# Patient Record
Sex: Male | Born: 1985 | Race: White | Hispanic: No | Marital: Single | Smoking: Former smoker
Health system: Southern US, Community
[De-identification: ages and names within clinical notes are randomized; demographics above are authoritative.]

---
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
## 2018-11-03 ENCOUNTER — Other Ambulatory Visit: Payer: Self-pay

## 2018-11-03 ENCOUNTER — Emergency Department (HOSPITAL_BASED_OUTPATIENT_CLINIC_OR_DEPARTMENT_OTHER)
Admission: EM | Admit: 2018-11-03 | Discharge: 2018-11-03 | Disposition: A | Discharge status: Home / Self Care | Payer: BC Managed Care – PPO | Attending: Emergency Medicine | Admitting: Emergency Medicine

## 2018-11-03 ENCOUNTER — Encounter (HOSPITAL_BASED_OUTPATIENT_CLINIC_OR_DEPARTMENT_OTHER): Payer: Self-pay | Admitting: *Deleted

## 2018-11-03 ENCOUNTER — Emergency Department (HOSPITAL_BASED_OUTPATIENT_CLINIC_OR_DEPARTMENT_OTHER): Payer: BC Managed Care – PPO

## 2018-11-03 DIAGNOSIS — Z87891 Personal history of nicotine dependence: Secondary | ICD-10-CM | POA: Diagnosis not present

## 2018-11-03 DIAGNOSIS — R0789 Other chest pain: Secondary | ICD-10-CM | POA: Insufficient documentation

## 2018-11-03 DIAGNOSIS — R079 Chest pain, unspecified: Secondary | ICD-10-CM | POA: Diagnosis present

## 2018-11-03 MED ORDER — ALUM & MAG HYDROXIDE-SIMETH 200-200-20 MG/5ML PO SUSP
15.0000 mL | Freq: Once | ORAL | Status: AC
  Administered 2018-11-03: 15 mL via ORAL
  Filled 2018-11-03: qty 30

## 2018-11-03 NOTE — Discharge Instructions (Signed)
Try zantac or pepcid twice a day.  Try to avoid things that may make this worse, most commonly these are spicy foods tomato based products fatty foods chocolate and peppermint.  Alcohol and tobacco can also make this worse.  Return to the emergency department for sudden worsening pain fever or inability to eat or drink. ° °

## 2018-11-03 NOTE — ED Triage Notes (Addendum)
Pt c/o mid sternal CP while sleeping , SOB, palpitations  , cp increased with deep breathing

## 2018-11-03 NOTE — ED Provider Notes (Signed)
[facility] EMERGENCY DEPARTMENT Provider Note   CSN: [id number] Arrival date & time: 11/03/18  1338     History   Chief Complaint Chief Complaint  Patient presents with  . Chest Pain    HPI Jared Lozano is a 33 y.o. male.  33 yo M with a cc of chest pain.  Occurred when he was trying to go to sleep.  Felt like pressure.  Improved with sitting up.  Felt he was having some trouble breathing initially and then resolved over about 20 minutes or so.  Denies exertional symptoms denies history of PE or DVT denies history of MI.  Denies hemoptysis denies lower extremity edema denies recent surgery or immobilization.  Denies cough congestion or fever.  Denies history of hypertension hyperlipidemia diabetes or smoking.  Father has had a stroke but denies history of heart attack.  The history is provided by the patient.  Chest Pain  Pain location:  Substernal area Pain quality: pressure   Pain radiates to:  Does not radiate Pain severity:  Moderate Onset quality:  Sudden Duration:  2 days Timing:  Constant Progression:  Worsening Chronicity:  New Relieved by:  Nothing Worsened by:  Nothing Ineffective treatments:  None tried Associated symptoms: no abdominal pain, no fever, no headache, no palpitations, no shortness of breath and no vomiting     History reviewed. No pertinent past medical history.  There are no active problems to display for this patient.   History reviewed. No pertinent surgical history.      Home Medications    Prior to Admission medications   Not on File    Family History History reviewed. No pertinent family history.  Social History Social History   Tobacco Use  . Smoking status: Former Games developer  . Smokeless tobacco: Never Used  Substance Use Topics  . Alcohol use: Not Currently  . Drug use: Not Currently     Allergies   Patient has no known allergies.   Review of Systems Review of Systems  Constitutional: Negative for  chills and fever.  HENT: Negative for congestion and facial swelling.   Eyes: Negative for discharge and visual disturbance.  Respiratory: Negative for shortness of breath.   Cardiovascular: Positive for chest pain. Negative for palpitations.  Gastrointestinal: Negative for abdominal pain, diarrhea and vomiting.  Musculoskeletal: Negative for arthralgias and myalgias.  Skin: Negative for color change and rash.  Neurological: Negative for tremors, syncope and headaches.  Psychiatric/Behavioral: Negative for confusion and dysphoric mood.     Physical Exam Updated Vital Signs BP (!) 141/97   Pulse 88   Temp 98.1 F (36.7 C)   Resp 18   Ht 5\' 8"  (1.727 m)   Wt 86.2 kg   SpO2 100%   BMI 28.89 kg/m   Physical Exam Vitals signs and nursing note reviewed.  Constitutional:      Appearance: He is well-developed.  HENT:     Head: Normocephalic and atraumatic.  Eyes:     Pupils: Pupils are equal, round, and reactive to light.  Neck:     Musculoskeletal: Normal range of motion and neck supple.     Vascular: No JVD.  Cardiovascular:     Rate and Rhythm: Normal rate and regular rhythm.     Heart sounds: No murmur. No friction rub. No gallop.   Pulmonary:     Effort: No respiratory distress.     Breath sounds: No wheezing.  Abdominal:     General: There is no  distension.     Tenderness: There is no guarding or rebound.  Musculoskeletal: Normal range of motion.  Skin:    Coloration: Skin is not pale.     Findings: No rash.  Neurological:     Mental Status: He is alert and oriented to person, place, and time.  Psychiatric:        Behavior: Behavior normal.      ED Treatments / Results  Labs (all labs ordered are listed, but only abnormal results are displayed) Labs Reviewed - No data to display  EKG EKG Interpretation  Date/Time:  Thursday November 03 2018 13:52:20 EST Ventricular Rate:  88 PR Interval:    QRS Duration: 101 QT Interval:  358 QTC Calculation: 434 R  Axis:   -25 Text Interpretation:  Sinus rhythm RSR' in V1 or V2, probably normal variant Probable left ventricular hypertrophy No old tracing to compare Confirmed by Melene Plan [phone]) on 11/03/2018 1:56:13 PM   Radiology Dg Chest 2 View  Result Date: 11/03/2018 CLINICAL DATA:  Heart palpitations. EXAM: CHEST - 2 VIEW COMPARISON:  No prior. FINDINGS: Mediastinum and hilar structures normal. Lungs are clear. No pleural effusion pneumothorax. Heart size normal. No acute bony abnormality. IMPRESSION: No acute cardiopulmonary disease. Electronically Signed   By: Maisie Fus  Register   On: 11/03/2018 14:25    Procedures Procedures (including critical care time)  Medications Ordered in ED Medications  alum & mag hydroxide-simeth (MAALOX/MYLANTA) 200-200-20 MG/5ML suspension 15 mL (has no administration in time range)     Initial Impression / Assessment and Plan / ED Course  I have reviewed the triage vital signs and the nursing notes.  Pertinent labs & imaging results that were available during my care of the patient were reviewed by me and considered in my medical decision making (see chart for details).     33 yo M with a chief complaint of atypical chest pain.  Will obtain the EKG and chest x-ray.  He is PERC negative.  History is completely atypical of ACS.  Feel that no lab work is required at this time.  CXR viewed by me without focal infiltrate or PTX.  D/c home.   2:30 PM:  I have discussed the diagnosis/risks/treatment options with the patient and family and believe the pt to be eligible for discharge home to follow-up with PCP. We also discussed returning to the ED immediately if new or worsening sx occur. We discussed the sx which are most concerning (e.g., sudden worsening pain, fever, inability to tolerate by mouth) that necessitate immediate return. Medications administered to the patient during their visit and any new prescriptions provided to the patient are listed  below.  Medications given during this visit Medications  alum & mag hydroxide-simeth (MAALOX/MYLANTA) 200-200-20 MG/5ML suspension 15 mL (has no administration in time range)     The patient appears reasonably screen and/or stabilized for discharge and I doubt any other medical condition or other [facility] requiring further screening, evaluation, or treatment in the ED at this time prior to discharge.    Final Clinical Impressions(s) / ED Diagnoses   Final diagnoses:  Atypical chest pain    ED Discharge Orders    None       Melene Plan, DO 11/03/18 1430

## 2019-11-24 ENCOUNTER — Ambulatory Visit: Payer: Self-pay | Attending: Internal Medicine

## 2019-11-24 DIAGNOSIS — Z20822 Contact with and (suspected) exposure to covid-19: Secondary | ICD-10-CM

## 2019-11-26 LAB — NOVEL CORONAVIRUS, NAA: SARS-CoV-2, NAA: NOT DETECTED

## 2020-02-21 IMAGING — DX DG CHEST 2V
2 series · 2 of 2 positions shown · non-contrast
Comparison: No prior.

CLINICAL DATA: Heart palpitations.

EXAM:
CHEST - 2 VIEW

[chest pa]
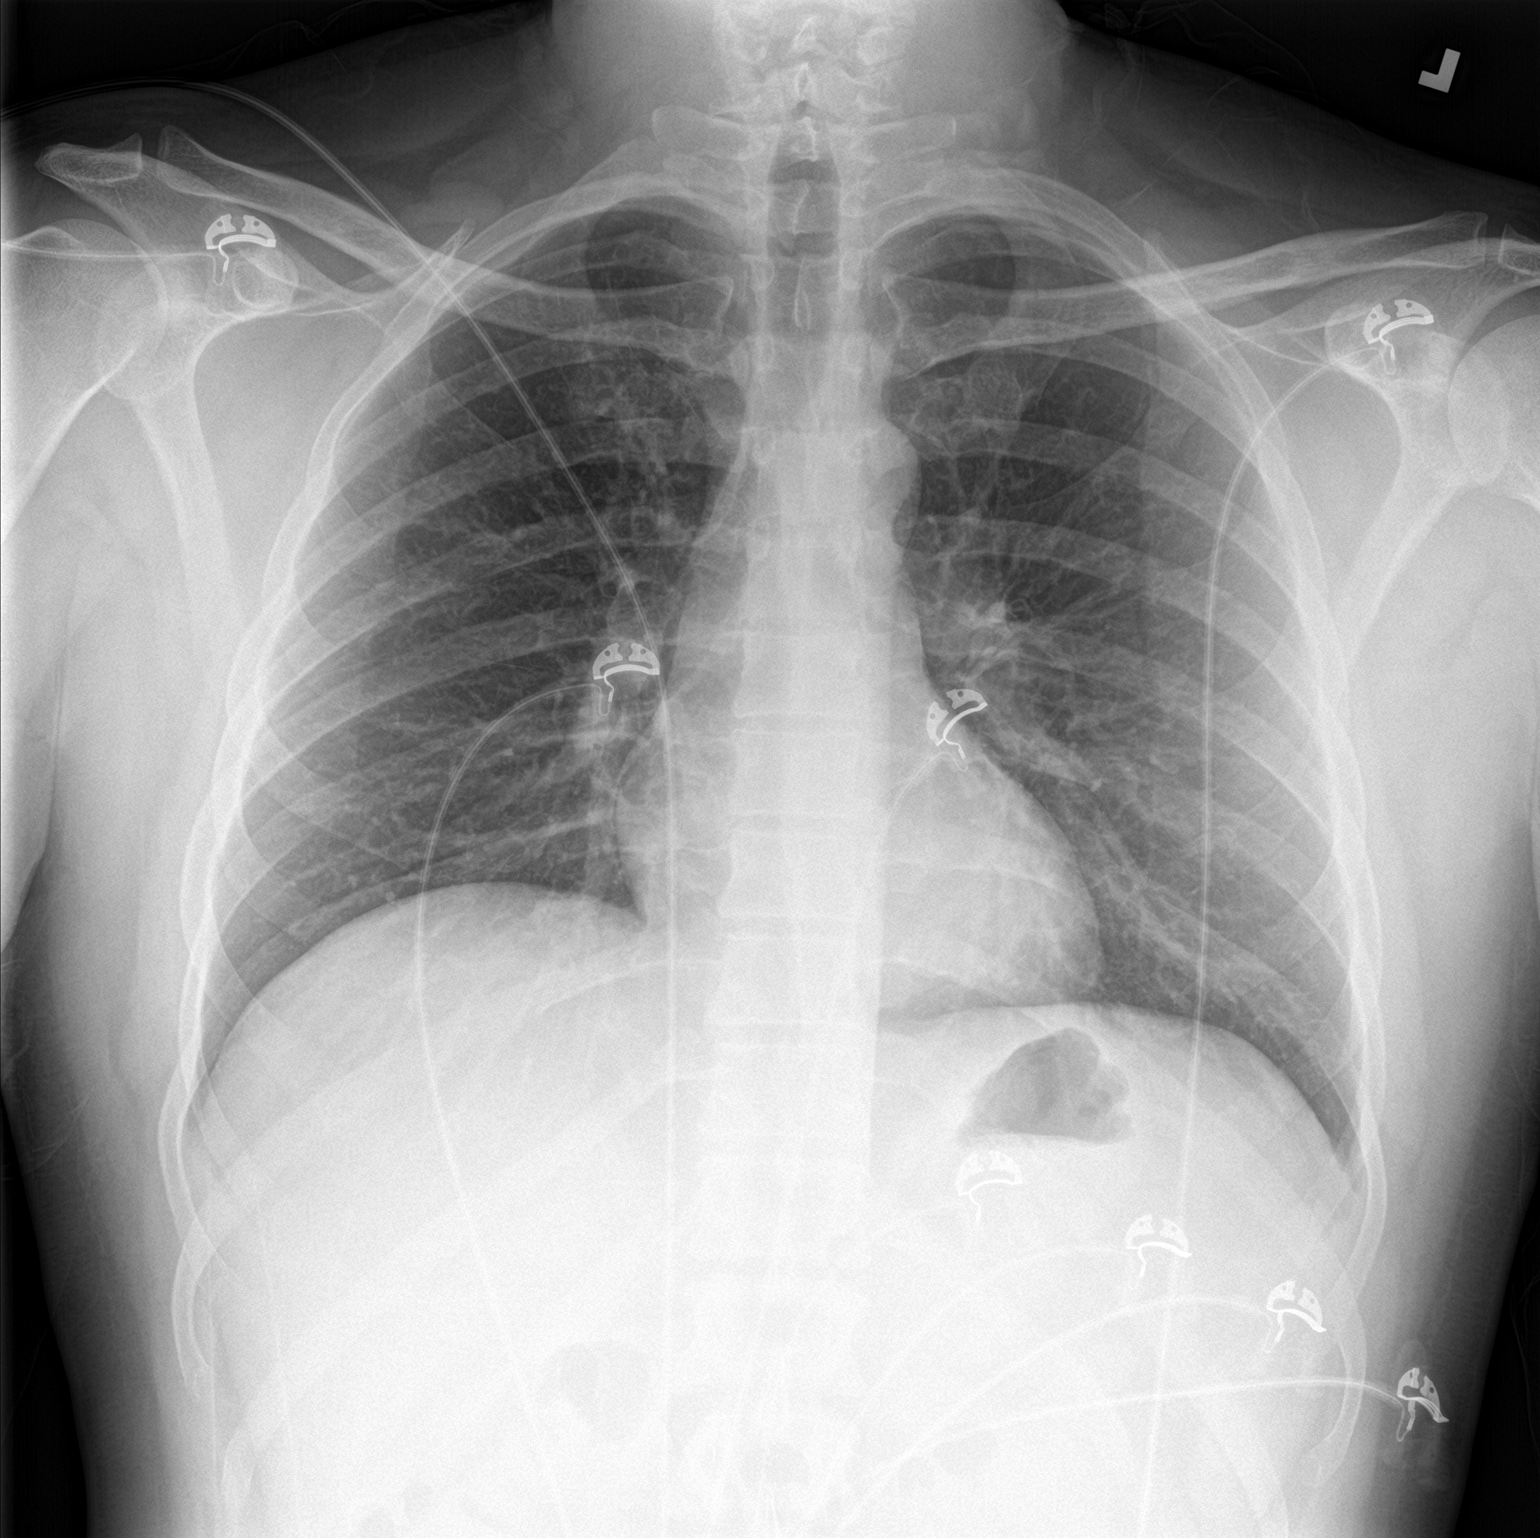

[chest lat]
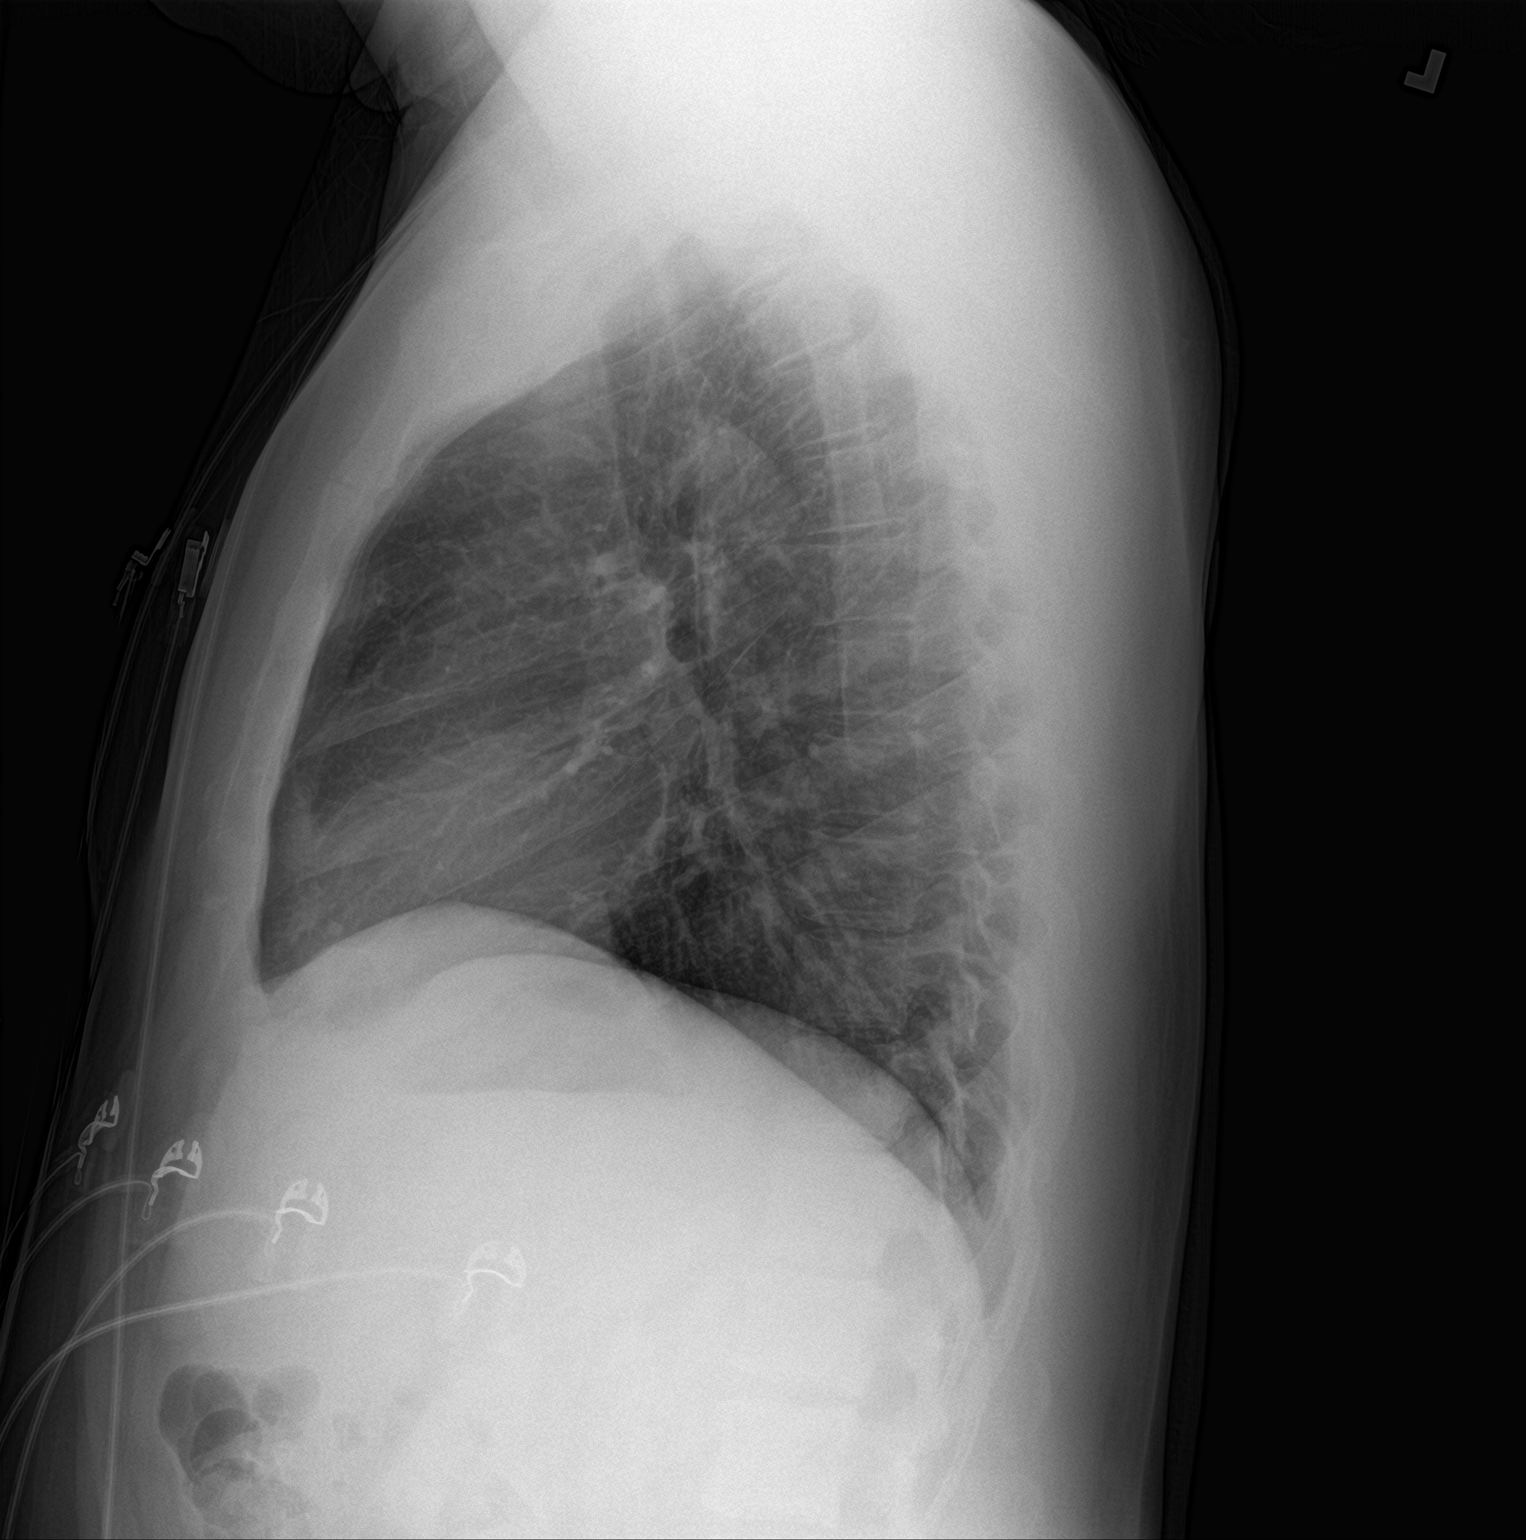

[2 of 2 positions shown; findings below may reference images not displayed]

FINDINGS: Mediastinum and hilar structures normal. Lungs are clear. No pleural
effusion pneumothorax. Heart size normal. No acute bony abnormality.
IMPRESSION: No acute cardiopulmonary disease.

## 2020-10-19 ENCOUNTER — Emergency Department: Payer: BC Managed Care – PPO

## 2020-10-19 ENCOUNTER — Emergency Department
Admission: EM | Admit: 2020-10-19 | Discharge: 2020-10-19 | Disposition: A | Discharge status: Home / Self Care | Payer: BC Managed Care – PPO | Attending: Emergency Medicine | Admitting: Emergency Medicine

## 2020-10-19 ENCOUNTER — Other Ambulatory Visit: Payer: Self-pay

## 2020-10-19 DIAGNOSIS — U071 COVID-19: Secondary | ICD-10-CM | POA: Diagnosis not present

## 2020-10-19 DIAGNOSIS — Z87891 Personal history of nicotine dependence: Secondary | ICD-10-CM | POA: Insufficient documentation

## 2020-10-19 DIAGNOSIS — R0602 Shortness of breath: Secondary | ICD-10-CM | POA: Diagnosis present

## 2020-10-19 LAB — BASIC METABOLIC PANEL
Anion gap: 11 (ref 5–15)
BUN: 10 mg/dL (ref 6–20)
CO2: 26 mmol/L (ref 22–32)
Calcium: 8.9 mg/dL (ref 8.9–10.3)
Chloride: 102 mmol/L (ref 98–111)
Creatinine, Ser: 1.01 mg/dL (ref 0.61–1.24)
GFR, Estimated: >60 mL/min (ref >60)
Glucose, Bld: 90 mg/dL (ref 70–99)
Potassium: 3.7 mmol/L (ref 3.5–5.1)
Sodium: 139 mmol/L (ref 135–145)

## 2020-10-19 LAB — CBC
HCT: 42.8 % (ref 39.0–52.0)
Hemoglobin: 15.2 g/dL (ref 13.0–17.0)
MCH: 30.8 pg (ref 26.0–34.0)
MCHC: 35.5 g/dL (ref 30.0–36.0)
MCV: 86.8 fL (ref 80.0–100.0)
Platelets: 237 10*3/uL (ref 150–400)
RBC: 4.93 MIL/uL (ref 4.22–5.81)
RDW: 11.9 % (ref 11.5–15.5)
WBC: 4.1 10*3/uL (ref 4.0–10.5)
nRBC: 0 % (ref 0.0–0.2)

## 2020-10-19 LAB — TROPONIN I (HIGH SENSITIVITY)
Troponin I (High Sensitivity): <2 ng/L (ref <18)
Troponin I (High Sensitivity): 2 ng/L (ref <18)

## 2020-10-19 MED ORDER — METHYLPREDNISOLONE 4 MG PO TBPK: ORAL_TABLET | ORAL | 0 refills | Status: AC

## 2020-10-19 MED ORDER — AZITHROMYCIN 250 MG PO TABS: ORAL_TABLET | ORAL | 0 refills | Status: AC

## 2020-10-19 NOTE — ED Triage Notes (Signed)
Pt comes pov with increasing SOB. Dx with covid on 27th. States has not gotten better.

## 2020-10-19 NOTE — Discharge Instructions (Signed)
Follow-up with your regular doctor as needed Return to the emergency department worsening Due to still being febrile and symptomatic he should remain out of work for another week If you begin to have chest pain or shortness of breath that is increasing you should return emergency department for evaluation All of your labs including heart enzymes, EKG, and chest x-ray are normal today Take over-the-counter vitamin C, vitamin D, and zinc to help boost your immune system Mucinex to help prevent mucous plugs in your chest Take a baby aspirin per day.

## 2020-10-19 NOTE — ED Notes (Signed)
Pt ambulated with spo2 monitoring. Pulse at start was 108, o2 at 97%.   Pt ambulated approx 400'. Pulse 120, spo2 maintained at 96-97%. Pt denies lightheaded/dizzy, does say he felt a little "winded" after. Steady gait  Upon return, pulse 104, spo2 96% RA

## 2020-10-19 NOTE — ED Provider Notes (Signed)
[facility] Emergency Department Provider Note  ____________________________________________   Event Date/Time   First MD Initiated Contact with Patient 10/19/20 1951     (approximate)  I have reviewed the triage vital signs and the nursing notes.   HISTORY  Chief Complaint Shortness of Breath    HPI Jared Lozano is a 35 y.o. male presents emergency department stating he has had Covid since 12/22.  States that he is continue to have fever.  States today is the first day he has not run a fever all day long.  States he has felt some chest pain or shortness of breath.  He is unsure if he needs to stay out of work.  States his oxygen had dropped at home although his been normal here.  States he does get a little winded when he walks.  He has no chest pain.  No leg pain.  No vomiting or diarrhea.  Patient has not taken steroids or other medications to help.    History reviewed. No pertinent past medical history.  There are no problems to display for this patient.   History reviewed. No pertinent surgical history.  Prior to Admission medications   Medication Sig Start Date End Date Taking? Authorizing Provider  azithromycin (ZITHROMAX Z-PAK) 250 MG tablet 2 pills today then 1 pill a day for 4 days 10/19/20  Yes Jaquez Farrington, Linden Dolin, PA-C  methylPREDNISolone (MEDROL DOSEPAK) 4 MG TBPK tablet Take 6 pills on day one then decrease by 1 pill each day 10/19/20  Yes Versie Starks, PA-C    Allergies Patient has no known allergies.  History reviewed. No pertinent family history.  Social History Social History   Tobacco Use  . Smoking status: Former Research scientist (life sciences)  . Smokeless tobacco: Never Used  Substance Use Topics  . Alcohol use: Not Currently  . Drug use: Not Currently    Review of Systems  Constitutional: Positive fever/chills Eyes: No visual changes. ENT: No sore throat. Respiratory: Denies cough, complains of some shortness of  breath Cardiovascular: Denies chest pain Gastrointestinal: Denies abdominal pain Genitourinary: Negative for dysuria. Musculoskeletal: Negative for back pain. Skin: Negative for rash. Psychiatric: no mood changes,     ____________________________________________   PHYSICAL EXAM:  VITAL SIGNS: ED Triage Vitals [10/19/20 1558]  Enc Vitals Group     BP (!) 141/91     Pulse Rate (!) 102     Resp 18     Temp 99.5 F (37.5 C)     Temp Source Oral     SpO2 99 %     Weight 190 lb (86.2 kg)     Height 5\' 9"  (1.753 m)     Head Circumference      Peak Flow      Pain Score 5     Pain Loc      Pain Edu?      Excl. in [place]?     Constitutional: Alert and oriented. Well appearing and in no acute distress. Eyes: Conjunctivae are normal.  Head: Atraumatic. Nose: No congestion/rhinnorhea. Mouth/Throat: Mucous membranes are moist.  Neck:  supple no lymphadenopathy noted Cardiovascular: Normal rate, regular rhythm. Heart sounds are normal Respiratory: Normal respiratory effort.  No retractions, lungs c t a  GU: deferred Musculoskeletal: FROM all extremities, warm and well perfused Neurologic:  Normal speech and language.  Skin:  Skin is warm, dry and intact. No rash noted. Psychiatric: Mood and affect are normal. Speech and behavior are normal.  ____________________________________________  LABS (all labs ordered are listed, but only abnormal results are displayed)  Labs Reviewed  BASIC METABOLIC PANEL  CBC  TROPONIN I (HIGH SENSITIVITY)  TROPONIN I (HIGH SENSITIVITY)   ____________________________________________   ____________________________________________  RADIOLOGY  Chest x-ray  ____________________________________________   PROCEDURES  Procedure(s) performed: no  Procedures    ____________________________________________   INITIAL IMPRESSION / ASSESSMENT AND PLAN / ED COURSE  Pertinent labs & imaging results that were available during my care of  the patient were reviewed by me and considered in my medical decision making (see chart for details).   Patient is 35 year old Covid positive male presents with some shortness of breath.  See HPI.  Physical exam shows the patient to appear stable.  Oxygen levels have remained well over 97% on room air.  Patient is at times a little tachycardic.  Ambulatory pulse ox did not drop below 97% on room air and heart rate went up to 104.  DDx: Covid, Covid pneumonia, MI, myocarditis, PE  CBC is normal, basic metabolic panel is normal, troponins normal,   Chest x-ray reviewed by me and confirmed by radiology is normal  Patient is able to Tory pulse ox remained normal.  I do not feel that he has a PE at this time as he appears to be well.  Patient does not have Covid pneumonia at this time.  Chest x-ray was normal.  Labs are also reassuring and do not indicate a MI.  Do feel this is just continued Covid symptoms as patient is unvaccinated.  He was given strict instructions to return to the emergency department if his symptoms are worsening.  We did talk about signs and symptoms of blood clots and myocarditis.  He was given a prescription for Z-Pak and steroid pack.  He was also given a work note to extend his time out of work.  He is to return emergency department if worsening.  He states he understands.  He was discharged in stable condition.  Jared Lozano was evaluated in Emergency Department on 10/19/2020 for the symptoms described in the history of present illness. He was evaluated in the context of the global COVID-19 pandemic, which necessitated consideration that the patient might be at risk for infection with the SARS-CoV-2 virus that causes COVID-19. Institutional protocols and algorithms that pertain to the evaluation of patients at risk for COVID-19 are in a state of rapid change based on information released by regulatory bodies including the CDC and federal and state organizations. These  policies and algorithms were followed during the patient's care in the ED.    As part of my medical decision making, I reviewed the following data within the electronic MEDICAL RECORD NUMBER Nursing notes reviewed and incorporated, Labs reviewed , Old chart reviewed, Radiograph reviewed , Notes from prior ED visits and [place] Controlled Substance Database  ____________________________________________   FINAL CLINICAL IMPRESSION(S) / ED DIAGNOSES  Final diagnoses:  COVID-19      NEW MEDICATIONS STARTED DURING THIS VISIT:  Discharge Medication List as of 10/19/2020  8:27 PM    START taking these medications   Details  azithromycin (ZITHROMAX Z-PAK) 250 MG tablet 2 pills today then 1 pill a day for 4 days, Print    methylPREDNISolone (MEDROL DOSEPAK) 4 MG TBPK tablet Take 6 pills on day one then decrease by 1 pill each day, Print         Note:  This document was prepared using Dragon voice recognition software and may include unintentional dictation  errors.    Faythe Ghee, PA-C 10/19/20 2113    Sharman Cheek, MD 10/20/20 [phone]
